# Patient Record
Sex: Female | Born: 1955 | Hispanic: No | Marital: Married
Health system: Southern US, Community
[De-identification: ages and names within clinical notes are randomized; demographics above are authoritative.]

---

## 2000-08-05 ENCOUNTER — Other Ambulatory Visit: Admission: RE | Admit: 2000-08-05 | Discharge: 2000-08-05 | Payer: Self-pay | Admitting: *Deleted

## 2000-09-05 ENCOUNTER — Encounter (INDEPENDENT_AMBULATORY_CARE_PROVIDER_SITE_OTHER): Payer: Self-pay | Admitting: Specialist

## 2000-09-05 ENCOUNTER — Other Ambulatory Visit: Admission: RE | Admit: 2000-09-05 | Discharge: 2000-09-05 | Payer: Self-pay | Admitting: *Deleted

## 2001-08-05 ENCOUNTER — Encounter (INDEPENDENT_AMBULATORY_CARE_PROVIDER_SITE_OTHER): Payer: Self-pay | Admitting: Specialist

## 2001-08-05 ENCOUNTER — Observation Stay (HOSPITAL_COMMUNITY): Admission: RE | Admit: 2001-08-05 | Discharge: 2001-08-06 | Payer: Self-pay | Admitting: *Deleted

## 2004-12-24 ENCOUNTER — Other Ambulatory Visit: Admission: RE | Admit: 2004-12-24 | Discharge: 2004-12-24 | Payer: Self-pay | Admitting: *Deleted

## 2005-01-23 ENCOUNTER — Ambulatory Visit (HOSPITAL_COMMUNITY): Admission: RE | Admit: 2005-01-23 | Discharge: 2005-01-23 | Payer: Self-pay | Admitting: *Deleted

## 2005-02-04 ENCOUNTER — Encounter: Admission: RE | Admit: 2005-02-04 | Discharge: 2005-02-04 | Payer: Self-pay | Admitting: *Deleted

## 2008-01-27 ENCOUNTER — Other Ambulatory Visit: Admission: RE | Admit: 2008-01-27 | Discharge: 2008-01-27 | Payer: Self-pay | Admitting: Family Medicine

## 2008-01-29 ENCOUNTER — Ambulatory Visit (HOSPITAL_COMMUNITY): Admission: RE | Admit: 2008-01-29 | Discharge: 2008-01-29 | Payer: Self-pay | Admitting: Family Medicine

## 2008-02-02 ENCOUNTER — Ambulatory Visit (HOSPITAL_COMMUNITY): Admission: RE | Admit: 2008-02-02 | Discharge: 2008-02-02 | Payer: Self-pay | Admitting: Family Medicine

## 2010-05-25 NOTE — Discharge Summary (Signed)
   NAME:  Sheila Gomez, Sheila Gomez                         ACCOUNT NO.:  [id number]   MEDICAL RECORD NO.:  [id number]                   PATIENT TYPE:  OBV   LOCATION:  0457                                 FACILITY:  [facility]   PHYSICIAN:  Almedia Balls. Fore, M.D.                DATE OF BIRTH:  26-Sep-1955   DATE OF ADMISSION:  08/05/2001  DATE OF DISCHARGE:  08/06/2001                                 DISCHARGE SUMMARY   HISTORY:  The patient is a 55 year old with rapid uterine enlargement,  abnormal uterine bleeding for hysterectomy, possible bilateral salpingo-  oophorectomy.  The remainder of her history and physical are as previously  dictated.   LABORATORY DATA:  Preoperative hemoglobin 12.3 with 3600 white blood cells  and normal differential.   HOSPITAL COURSE:  The patient was taken to the operating room on August 05, 2001 at which time abdominal supracervical hysterectomy, right salpingo-  oophorectomy were performed.  The patient did well postoperatively.  Diet  and ambulation were progressed over the evening of July 30 and early morning  of July 31.  On the morning of July 31 she was afebrile and experiencing no  problems and it was felt that she could be discharged at this time.   FINAL DIAGNOSES:  Abnormal uterine bleeding, rapid uterine enlargement,  pelvic pain.   OPERATION:  Abdominal supracervical hysterectomy, right salpingo-  oophorectomy.  Pathology report unavailable at the time of dictation.   DISPOSITION:  Discharged home to return to the office in two weeks for  follow-up.  She was instructed to gradually progress her activities over  several weeks at home and to limit lifting and driving for two weeks.  She  was fully ambulatory, on a regular diet, and in good condition at the time  of discharge.  She was given a prescription for Dilaudid generic 2 mg 30 to  be taken one or two q.4h. p.r.n. pain and doxycycline 100 mg 12 to be taken  one b.i.d.  She will call for any  problems.                                               Almedia Balls. Randell Patient, M.D.    SRF/MEDQ  D:  08/06/2001  T:  08/12/2001  Job:  [id number]

## 2010-05-25 NOTE — Op Note (Signed)
Sheila Gomez, Sheila Gomez                        ACCOUNT NO.:  [id number]   MEDICAL RECORD NO.:  [id number]                   PATIENT TYPE:  AMB   LOCATION:  DAY                                  FACILITY:  [facility]   PHYSICIAN:  Almedia Balls. Fore, M.D.                DATE OF BIRTH:   DATE OF PROCEDURE:  08/05/2001  DATE OF DISCHARGE:                                 OPERATIVE REPORT   PREOPERATIVE DIAGNOSES:  1. Abnormal uterine bleeding.  2. Uterine enlargement, probable leiomyomata, question of adenomyosis.   POSTOPERATIVE DIAGNOSES:  1. Abnormal uterine bleeding.  2. Uterine enlargement, probable leiomyomata, question of adenomyosis.  3. Pending pathology.   OPERATION/PROCEDURE:  Abdominal supracervical hysterectomy, right salpingo-  oophorectomy.   ANESTHESIA:  General orotracheal.   SURGEON:  Almedia Balls. Randell Patient, M.D.   FIRST ASSISTANT:  Dr. Harl Bowie, M.D.   INDICATIONS:  The patient is a 55 year old with the above noted problems.  She was counseled as to the need for surgery and, in fact, the surgery to be  performed.  She was also counseled as to the risks involved including risks  of anesthesia, injury to bowel, bladder, blood vessels, ureters,  postoperative hemorrhage, infection, recuperation and use of hormone  replacement should her ovaries be removed.  She fully understands all these  considerations and wishes to proceed on 08/05/2001.   OPERATIVE FINDINGS:  On entry into the abdomen, the uterus was enlarged to  approximately [redacted] weeks gestational size.  There was a cyst present on the  right ovary which was hemorrhagic.  There was a smaller hemorrhagic corpus  luteum on the left ovary as well.  Otherwise the pelvic viscera were normal.  The abdominal viscera were normal.  The upper abdominal viscera were normal  to palpation.   DESCRIPTION OF PROCEDURE:  With the patient under general anesthesia, she  was prepped and draped in the usual sterile fashion.  I left  a  Foley  catheter in the bladder.  A abdominal transverse incision was made and  carried into the peritoneal cavity without difficulty.  The uterine fundus  was brought up through the incision, and Kelly clamps were placed across the  uterine-ovarian anastomosis.  tubes and round ligaments bilaterally for  traction and  hemostasis.  Suture ligatures were used to secure the round  ligaments and then transected with opening of the retroperitoneal space and  development of the bladder flap anteriorly.  Because of the length of the  uterine-ovarian anastomoses and tube attachments, it was necessary to remove  these sequentially.  Because of the large hemorrhagic cyst on the right  ovary, this was excised by clamping the infundibulopelvic ligament lateral  to the ovary, cutting it free and doubly ligating the infundibulopelvic  ligament.  On the left, the left tube and ovary were conserved by clamping  the uterine-ovarian anastomosis into which were then cut free of the  uterus  and doubly ligated with #1 chromic catgut.  Bladder flap was developed and  advanced over the lower uterine segment and cervix.  Heaney clamps were then  used after skeletonization of the vessels bilaterally to clamp these vessels  which were then cut and suture ligated with #1 chromic catgut.  Heaney  clamps were used to clamp the cardinal ligaments bilaterally which were also  cut and suture ligated with #1 chromic catgut.  It was then possible to  excise the uterine fundus by using Bovie electrocoagulation to remove the  fundus and core out the cervix.  This was accomplished without difficulty.  Interrupted figure-of-eight sutures of 0 chromic catgut were placed across  the cervix for reapproximation and hemostasis.  The area was lavaged with  copious amounts of lactated Ringer's.  Finally, after noting that hemostasis  was maintained and the sponge and instrument counts were correct, and after  reperitonealizing the  surgical site with interrupted suture of #1 chromic  catgut, the left tube and ovary were suspended from the left round ligament  with an interrupted suture of #1 chromic catgut.  Again, after noting the  hemostasis was maintained and the sponge and instrument counts were correct,  the peritoneum was closed with a continuous suture of 0 Vicryl.  Fascia was  closed with two sutures of 0 Vicryl.  This was brought from the lateral  aspects of the incision and tied separately in the midline.  Subcutaneous  fat was reapproximated with interrupted sutures of 0 Vicryl.  Skin was  closed with a subcuticular suture of 3-0 plain catgut.  Estimated blood loss  400 ml.  The patient was taken to the recovery room in good condition with  clear urine in the Foley catheter tubing.  She will be placed on 23-hour  observation following the surgery.                                                 Almedia Balls. Randell Patient, M.D.    SRF/MEDQ  D:  08/05/2001  T:  08/08/2001  Job:  [id number]

## 2010-05-25 NOTE — H&P (Signed)
NAME:  Sheila Gomez, Sheila Gomez                         ACCOUNT NO.: [id number]   MEDICAL RECORD NO.:  [id number]                   PATIENT TYPE: OBV   LOCATION:  0457                                 FACILITY:  [facility]   PHYSICIAN:  Almedia Balls. Fore, M.D.                DATE OF BIRTH:  Dec 19, 1955   DATE OF ADMISSION:  08/05/2001  DATE OF DISCHARGE:             HISTORY & PHYSICAL   CHIEF COMPLAINT:  Probable fibroids, heavybleeding.   HISTORY:  The patient is a 55 year old gravida 3, para 3, whose last  menstrual period was late June 2003. She has been followed in our office  over the past year with gradually increasing probable fibroids of the  uterus. She underwent a hysteroscopy, D&C in August 2002 with benign  findings. She was also experiencing extremely heavy menses and has had  continuationof this problem. An examination  in early July this year  revealed the uterus to be enlarged even further, and to be approximately  three to four fingerbreadths above the umbilicus at this point.  Because of  the relatively rampant enlargement and continued abnormal bleeding and  abdominal discomfort, she is admitted at this time for abdominal  hysterectomy and possible bilateral salpingo oophorectomy.  This procedure  has been  fully discussed with her to include the procedure itself, the  risks of the  anesthesia, injury to bowel and bladder, blood vessels,  ureters, postoperative hemorrhage, infection, recuperation and possible  hormone replacement should her ovaries be removed. She fully understands all  these considerations and wishes to proceed on August 05, 2001. Her Pap smear  was normal within  the past year.   PAST MEDICAL HISTORY:  She had a tubal ligation in approximately 1990.   ALLERGIES:  Sheis allergic to no medications.   MEDICATIONS:  She has taken only iron andvitamins over the past year for  improvement in her hemoglobin.   FAMILY HISTORY:  Noncontributory.   REVIEW OF SYMPTOMS:   HEENT:  Negative.  CARDIORESPIRATORY:  Negative.  GASTROINTESTINAL:  Negative.  GENITOURINARY:  As in present illness.  NEUROMUSCULAR:  Negative.   PHYSICAL EXAMINATION:  GENERAL:  Height 5 feet, 3-1/2 inches, weight 155  pounds. A well developed black female in no acute  distress.   VITAL SIGNS:  Blood pressure 132-80, pulse 72, respirations 18.   HEENT:  Within normal limits.   NECK:  Supple without masses or adenopathy or bruits.   HEART:  Regular rate and rhythm without murmurs.   LUNGS:  Clear to auscultation and percussion.   BREAST: Examination sitting and lying without mass, axilla negative.   ABDOMEN:  Flat and soft was mass effect to above the umbilicus. The mass is  irregular and slightly tender.   PELVIC:  Examination, external genitalia Bartholin's, urethra and Skene's  glands within normal limits.  Cervix slightly inflamed. Uterus antero mid in  position and enlarged to approximately 22 to [redacted] weeks gestational size, very  irregular. It is  not possible to palpate the  adnexa, althoughan ultrasound  done within  the past year has been  normal.  Anterior and posterior cul-de-  sac examination was confirmatory.   EXTREMITIES:  Within normal limits.   CENTRAL NERVOUS SYSTEM:  Grossly intact.   SKIN:  Without suspicious lesions.   IMPRESSION:  Uterine  enlargement, probable leiomyomata, abnormal bleeding  and pain.   DISPOSITION:  As noted above.                                                   Almedia Balls. Randell Patient, M.D.    SRF/MEDQ  D:  08/03/2001  T:  08/05/2001  Job:  [id number]

## 2010-08-08 ENCOUNTER — Other Ambulatory Visit (HOSPITAL_COMMUNITY): Payer: Self-pay | Admitting: Family Medicine

## 2010-08-08 DIAGNOSIS — Z1231 Encounter for screening mammogram for malignant neoplasm of breast: Secondary | ICD-10-CM

## 2010-08-19 ENCOUNTER — Other Ambulatory Visit (HOSPITAL_COMMUNITY)
Admission: RE | Admit: 2010-08-19 | Discharge: 2010-08-19 | Disposition: A | Discharge status: Home / Self Care | Payer: BC Managed Care – PPO | Source: Ambulatory Visit | Attending: Family Medicine | Admitting: Family Medicine

## 2010-08-19 DIAGNOSIS — Z1159 Encounter for screening for other viral diseases: Secondary | ICD-10-CM | POA: Insufficient documentation

## 2010-08-20 ENCOUNTER — Ambulatory Visit (HOSPITAL_COMMUNITY)
Admission: RE | Admit: 2010-08-20 | Discharge: 2010-08-20 | Disposition: A | Discharge status: Home / Self Care | Payer: BC Managed Care – PPO | Source: Ambulatory Visit | Attending: Family Medicine | Admitting: Family Medicine

## 2010-08-20 DIAGNOSIS — Z1231 Encounter for screening mammogram for malignant neoplasm of breast: Secondary | ICD-10-CM | POA: Insufficient documentation

## 2010-08-31 ENCOUNTER — Other Ambulatory Visit: Payer: Self-pay | Admitting: Family Medicine

## 2010-09-03 ENCOUNTER — Other Ambulatory Visit (HOSPITAL_COMMUNITY)
Admission: RE | Admit: 2010-09-03 | Discharge: 2010-09-03 | Disposition: A | Discharge status: Home / Self Care | Payer: BC Managed Care – PPO | Source: Ambulatory Visit | Attending: Family Medicine | Admitting: Family Medicine

## 2010-09-03 DIAGNOSIS — Z01419 Encounter for gynecological examination (general) (routine) without abnormal findings: Secondary | ICD-10-CM | POA: Insufficient documentation

## 2012-02-18 ENCOUNTER — Other Ambulatory Visit: Payer: Self-pay | Admitting: Family Medicine

## 2012-02-18 ENCOUNTER — Other Ambulatory Visit (HOSPITAL_COMMUNITY)
Admission: RE | Admit: 2012-02-18 | Discharge: 2012-02-18 | Disposition: A | Discharge status: Home / Self Care | Payer: BC Managed Care – PPO | Source: Ambulatory Visit | Attending: Family Medicine | Admitting: Family Medicine

## 2012-02-18 ENCOUNTER — Ambulatory Visit
Admission: RE | Admit: 2012-02-18 | Discharge: 2012-02-18 | Disposition: A | Discharge status: Home / Self Care | Payer: BC Managed Care – PPO | Source: Ambulatory Visit | Attending: Family Medicine | Admitting: Family Medicine

## 2012-02-18 DIAGNOSIS — R0689 Other abnormalities of breathing: Secondary | ICD-10-CM

## 2012-02-18 DIAGNOSIS — Z01419 Encounter for gynecological examination (general) (routine) without abnormal findings: Secondary | ICD-10-CM | POA: Insufficient documentation

## 2012-02-26 ENCOUNTER — Other Ambulatory Visit (HOSPITAL_COMMUNITY): Payer: Self-pay | Admitting: Family Medicine

## 2012-02-26 DIAGNOSIS — Z1231 Encounter for screening mammogram for malignant neoplasm of breast: Secondary | ICD-10-CM

## 2012-03-03 ENCOUNTER — Ambulatory Visit (HOSPITAL_COMMUNITY)
Admission: RE | Admit: 2012-03-03 | Discharge: 2012-03-03 | Disposition: A | Discharge status: Home / Self Care | Payer: BC Managed Care – PPO | Source: Ambulatory Visit | Attending: Family Medicine | Admitting: Family Medicine

## 2012-03-03 DIAGNOSIS — Z1231 Encounter for screening mammogram for malignant neoplasm of breast: Secondary | ICD-10-CM | POA: Insufficient documentation

## 2012-03-05 ENCOUNTER — Other Ambulatory Visit: Payer: Self-pay | Admitting: Family Medicine

## 2012-03-17 ENCOUNTER — Ambulatory Visit
Admission: RE | Admit: 2012-03-17 | Discharge: 2012-03-17 | Disposition: A | Discharge status: Home / Self Care | Payer: BC Managed Care – PPO | Source: Ambulatory Visit | Attending: Family Medicine | Admitting: Family Medicine

## 2012-03-17 DIAGNOSIS — R928 Other abnormal and inconclusive findings on diagnostic imaging of breast: Secondary | ICD-10-CM

## 2013-08-19 ENCOUNTER — Other Ambulatory Visit (HOSPITAL_COMMUNITY): Payer: Self-pay | Admitting: Family Medicine

## 2013-08-19 DIAGNOSIS — Z1231 Encounter for screening mammogram for malignant neoplasm of breast: Secondary | ICD-10-CM

## 2013-08-25 ENCOUNTER — Ambulatory Visit (HOSPITAL_COMMUNITY)
Admission: RE | Admit: 2013-08-25 | Discharge: 2013-08-25 | Disposition: A | Discharge status: Home / Self Care | Payer: BC Managed Care – PPO | Source: Ambulatory Visit | Attending: Family Medicine | Admitting: Family Medicine

## 2013-08-25 DIAGNOSIS — Z1231 Encounter for screening mammogram for malignant neoplasm of breast: Secondary | ICD-10-CM | POA: Diagnosis not present

## 2014-10-17 ENCOUNTER — Other Ambulatory Visit: Payer: Self-pay

## 2014-10-17 DIAGNOSIS — Z1231 Encounter for screening mammogram for malignant neoplasm of breast: Secondary | ICD-10-CM

## 2014-10-24 ENCOUNTER — Ambulatory Visit
Admission: RE | Admit: 2014-10-24 | Discharge: 2014-10-24 | Disposition: A | Discharge status: Home / Self Care | Payer: BLUE CROSS/BLUE SHIELD | Source: Ambulatory Visit

## 2014-10-24 DIAGNOSIS — Z1231 Encounter for screening mammogram for malignant neoplasm of breast: Secondary | ICD-10-CM

## 2015-10-19 ENCOUNTER — Other Ambulatory Visit: Payer: Self-pay | Admitting: Family Medicine

## 2015-10-19 ENCOUNTER — Other Ambulatory Visit (HOSPITAL_COMMUNITY)
Admission: RE | Admit: 2015-10-19 | Discharge: 2015-10-19 | Disposition: A | Discharge status: Home / Self Care | Payer: BLUE CROSS/BLUE SHIELD | Source: Ambulatory Visit | Attending: Family Medicine | Admitting: Family Medicine

## 2015-10-19 DIAGNOSIS — Z1151 Encounter for screening for human papillomavirus (HPV): Secondary | ICD-10-CM | POA: Diagnosis present

## 2015-10-19 DIAGNOSIS — Z01419 Encounter for gynecological examination (general) (routine) without abnormal findings: Secondary | ICD-10-CM | POA: Insufficient documentation

## 2015-10-20 ENCOUNTER — Other Ambulatory Visit: Payer: Self-pay | Admitting: Family Medicine

## 2015-10-20 DIAGNOSIS — Z1231 Encounter for screening mammogram for malignant neoplasm of breast: Secondary | ICD-10-CM

## 2015-10-24 LAB — CYTOLOGY - PAP
Diagnosis: NEGATIVE
HPV (WINDOPATH): NOT DETECTED

## 2015-11-16 ENCOUNTER — Ambulatory Visit
Admission: RE | Admit: 2015-11-16 | Discharge: 2015-11-16 | Disposition: A | Discharge status: Home / Self Care | Payer: BLUE CROSS/BLUE SHIELD | Source: Ambulatory Visit | Attending: Family Medicine | Admitting: Family Medicine

## 2015-11-16 DIAGNOSIS — Z1231 Encounter for screening mammogram for malignant neoplasm of breast: Secondary | ICD-10-CM

## 2016-10-31 ENCOUNTER — Other Ambulatory Visit: Payer: Self-pay | Admitting: Family Medicine

## 2016-10-31 DIAGNOSIS — Z1231 Encounter for screening mammogram for malignant neoplasm of breast: Secondary | ICD-10-CM

## 2016-11-22 ENCOUNTER — Other Ambulatory Visit: Payer: Self-pay | Admitting: Family Medicine

## 2016-11-22 DIAGNOSIS — E2839 Other primary ovarian failure: Secondary | ICD-10-CM

## 2016-12-05 ENCOUNTER — Ambulatory Visit: Payer: BLUE CROSS/BLUE SHIELD

## 2017-01-01 ENCOUNTER — Ambulatory Visit
Admission: RE | Admit: 2017-01-01 | Discharge: 2017-01-01 | Disposition: A | Discharge status: Home / Self Care | Payer: BLUE CROSS/BLUE SHIELD | Source: Ambulatory Visit | Attending: Family Medicine | Admitting: Family Medicine

## 2017-01-01 DIAGNOSIS — E2839 Other primary ovarian failure: Secondary | ICD-10-CM

## 2017-01-01 DIAGNOSIS — Z1231 Encounter for screening mammogram for malignant neoplasm of breast: Secondary | ICD-10-CM

## 2017-12-12 ENCOUNTER — Other Ambulatory Visit: Payer: Self-pay | Admitting: Family Medicine

## 2017-12-12 DIAGNOSIS — Z1231 Encounter for screening mammogram for malignant neoplasm of breast: Secondary | ICD-10-CM

## 2018-01-21 ENCOUNTER — Ambulatory Visit
Admission: RE | Admit: 2018-01-21 | Discharge: 2018-01-21 | Disposition: A | Discharge status: Home / Self Care | Payer: Managed Care, Other (non HMO) | Source: Ambulatory Visit | Attending: Family Medicine | Admitting: Family Medicine

## 2018-01-21 DIAGNOSIS — Z1231 Encounter for screening mammogram for malignant neoplasm of breast: Secondary | ICD-10-CM

## 2018-08-05 ENCOUNTER — Other Ambulatory Visit: Payer: Self-pay | Admitting: Family Medicine

## 2018-08-05 DIAGNOSIS — Z20822 Contact with and (suspected) exposure to covid-19: Secondary | ICD-10-CM

## 2018-08-07 LAB — NOVEL CORONAVIRUS, NAA: SARS-CoV-2, NAA: NOT DETECTED

## 2018-11-16 ENCOUNTER — Other Ambulatory Visit: Payer: Self-pay

## 2018-11-16 DIAGNOSIS — Z20822 Contact with and (suspected) exposure to covid-19: Secondary | ICD-10-CM

## 2018-11-17 LAB — NOVEL CORONAVIRUS, NAA: SARS-CoV-2, NAA: NOT DETECTED

## 2018-12-28 ENCOUNTER — Ambulatory Visit: Payer: Managed Care, Other (non HMO) | Attending: Internal Medicine

## 2018-12-28 ENCOUNTER — Other Ambulatory Visit: Payer: Self-pay | Admitting: Family Medicine

## 2018-12-28 DIAGNOSIS — Z1231 Encounter for screening mammogram for malignant neoplasm of breast: Secondary | ICD-10-CM

## 2018-12-28 DIAGNOSIS — Z20822 Contact with and (suspected) exposure to covid-19: Secondary | ICD-10-CM

## 2018-12-29 LAB — NOVEL CORONAVIRUS, NAA: SARS-CoV-2, NAA: NOT DETECTED

## 2019-02-08 ENCOUNTER — Ambulatory Visit: Payer: Managed Care, Other (non HMO) | Attending: Internal Medicine

## 2019-02-08 DIAGNOSIS — Z20822 Contact with and (suspected) exposure to covid-19: Secondary | ICD-10-CM

## 2019-02-09 LAB — NOVEL CORONAVIRUS, NAA: SARS-CoV-2, NAA: NOT DETECTED

## 2019-02-10 ENCOUNTER — Other Ambulatory Visit: Payer: Self-pay

## 2019-02-10 ENCOUNTER — Ambulatory Visit
Admission: RE | Admit: 2019-02-10 | Discharge: 2019-02-10 | Disposition: A | Discharge status: Home / Self Care | Payer: Managed Care, Other (non HMO) | Source: Ambulatory Visit | Attending: Family Medicine | Admitting: Family Medicine

## 2019-02-10 DIAGNOSIS — Z1231 Encounter for screening mammogram for malignant neoplasm of breast: Secondary | ICD-10-CM

## 2020-01-14 ENCOUNTER — Other Ambulatory Visit: Payer: Self-pay | Admitting: Family Medicine

## 2020-01-14 DIAGNOSIS — Z1231 Encounter for screening mammogram for malignant neoplasm of breast: Secondary | ICD-10-CM

## 2020-02-23 ENCOUNTER — Ambulatory Visit: Payer: Managed Care, Other (non HMO)

## 2020-04-12 ENCOUNTER — Inpatient Hospital Stay: Admission: RE | Admit: 2020-04-12 | Payer: Managed Care, Other (non HMO) | Source: Ambulatory Visit

## 2020-05-04 ENCOUNTER — Other Ambulatory Visit: Payer: Self-pay

## 2020-05-04 ENCOUNTER — Ambulatory Visit
Admission: RE | Admit: 2020-05-04 | Discharge: 2020-05-04 | Disposition: A | Discharge status: Home / Self Care | Payer: Managed Care, Other (non HMO) | Source: Ambulatory Visit | Attending: Family Medicine | Admitting: Family Medicine

## 2020-05-04 DIAGNOSIS — Z1231 Encounter for screening mammogram for malignant neoplasm of breast: Secondary | ICD-10-CM

## 2021-05-16 ENCOUNTER — Other Ambulatory Visit: Payer: Self-pay | Admitting: Family Medicine

## 2021-05-16 DIAGNOSIS — Z1231 Encounter for screening mammogram for malignant neoplasm of breast: Secondary | ICD-10-CM

## 2021-05-21 ENCOUNTER — Ambulatory Visit
Admission: RE | Admit: 2021-05-21 | Discharge: 2021-05-21 | Disposition: A | Discharge status: Home / Self Care | Payer: Managed Care, Other (non HMO) | Source: Ambulatory Visit | Attending: Family Medicine | Admitting: Family Medicine

## 2021-05-21 DIAGNOSIS — Z1231 Encounter for screening mammogram for malignant neoplasm of breast: Secondary | ICD-10-CM

## 2022-04-09 ENCOUNTER — Other Ambulatory Visit: Payer: Self-pay | Admitting: Family Medicine

## 2022-04-09 DIAGNOSIS — Z1231 Encounter for screening mammogram for malignant neoplasm of breast: Secondary | ICD-10-CM

## 2022-06-24 ENCOUNTER — Ambulatory Visit
Admission: RE | Admit: 2022-06-24 | Discharge: 2022-06-24 | Disposition: A | Discharge status: Home / Self Care | Payer: Managed Care, Other (non HMO) | Source: Ambulatory Visit | Attending: Family Medicine | Admitting: Family Medicine

## 2022-06-24 DIAGNOSIS — Z1231 Encounter for screening mammogram for malignant neoplasm of breast: Secondary | ICD-10-CM

## 2022-12-16 IMAGING — MG MM DIGITAL SCREENING BILAT W/ TOMO AND CAD
8 series · 8 of 24 positions shown · non-contrast
Comparison: Previous exam(s).

CLINICAL DATA: Screening.

EXAM:
DIGITAL SCREENING BILATERAL MAMMOGRAM WITH TOMOSYNTHESIS AND CAD
TECHNIQUE: Bilateral screening digital craniocaudal and mediolateral oblique
mammograms were obtained. Bilateral screening digital breast
tomosynthesis was performed. The images were evaluated with
computer-aided detection.

[L CC synth-2D]
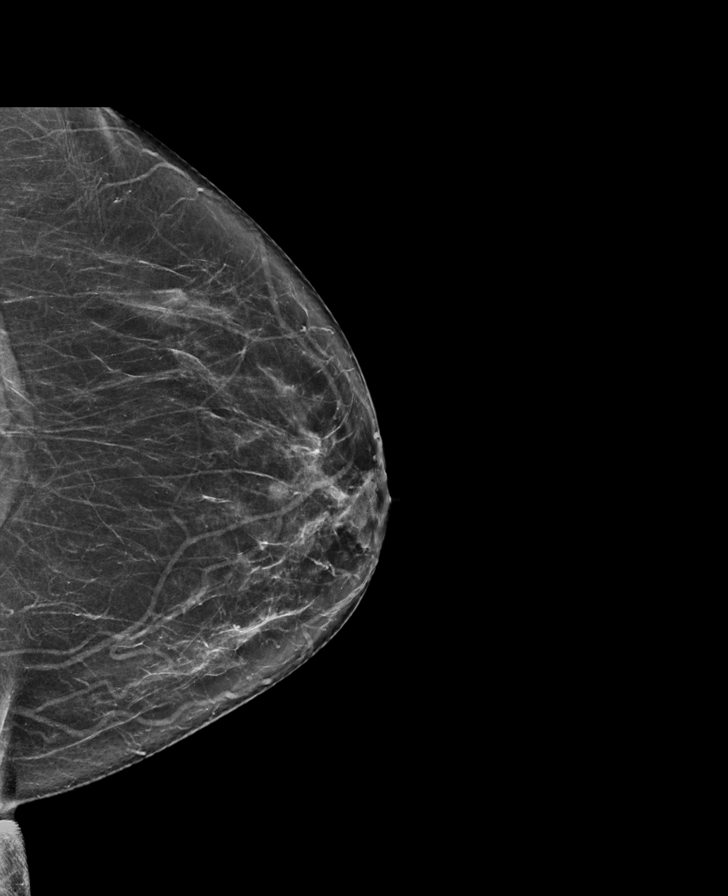

[R MLO synth-2D]
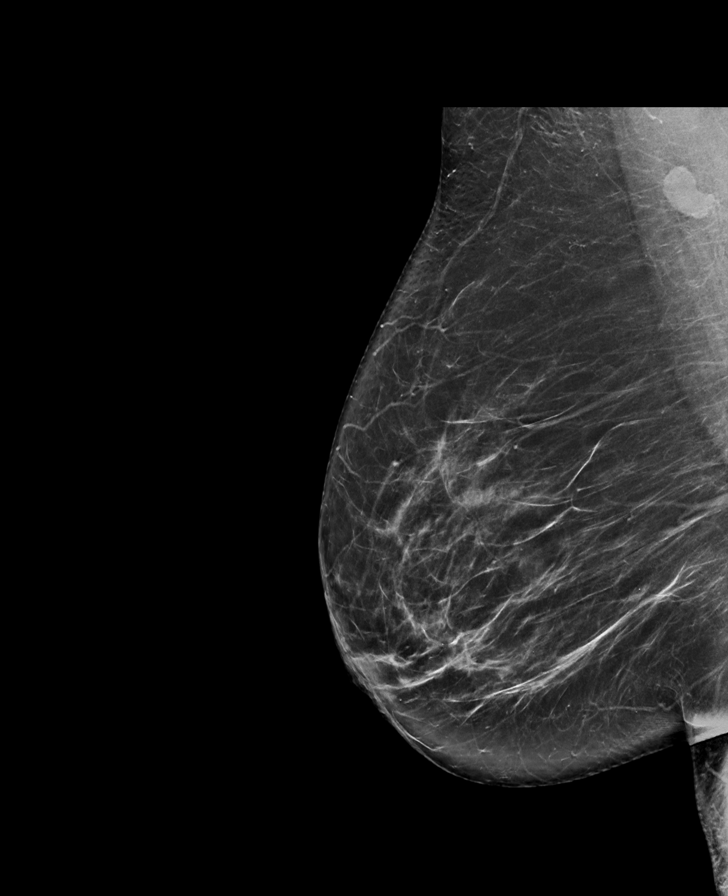

[R CC synth-2D]
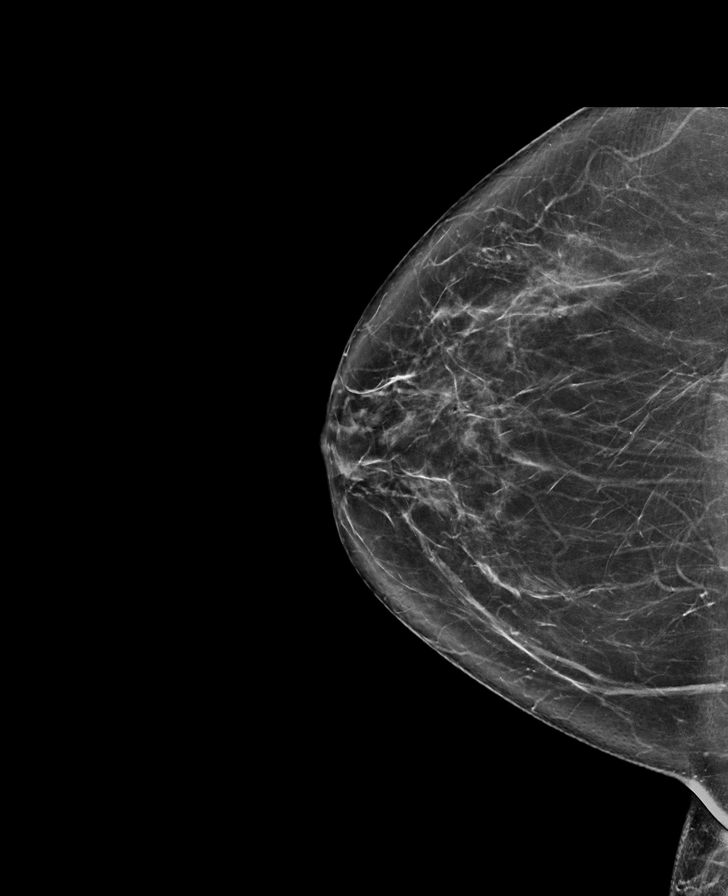

[L MLO synth-2D]
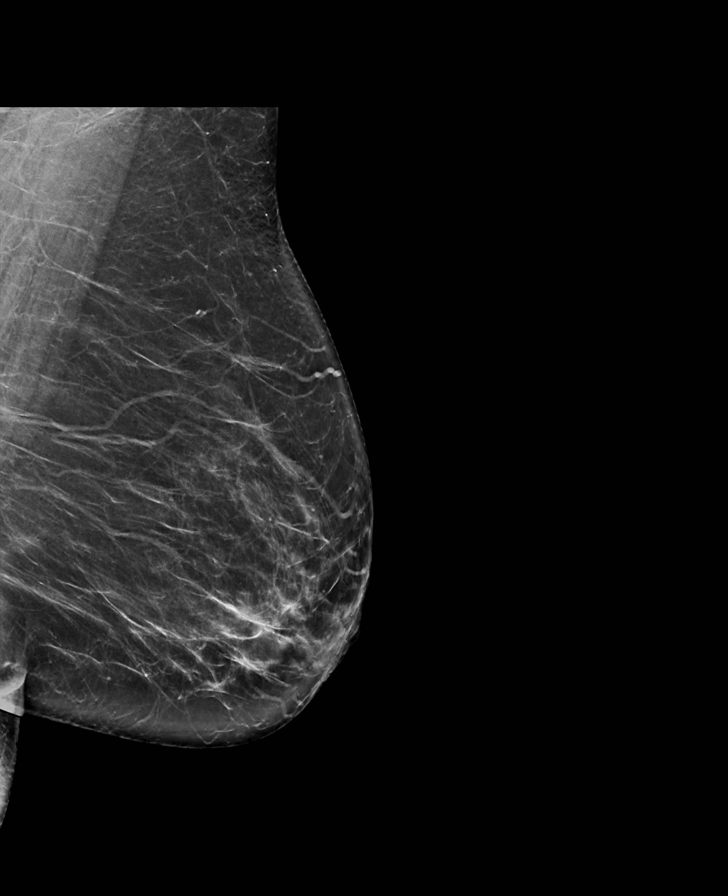

[L MLO tomo · tomo slice 40/79.0]
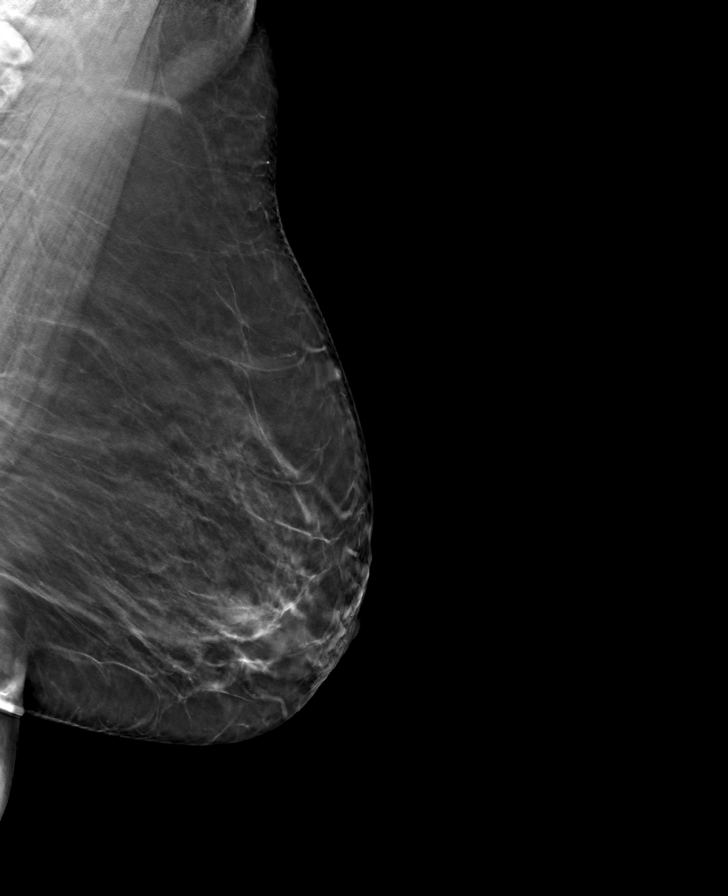

[R CC tomo · tomo slice 39/77.0]
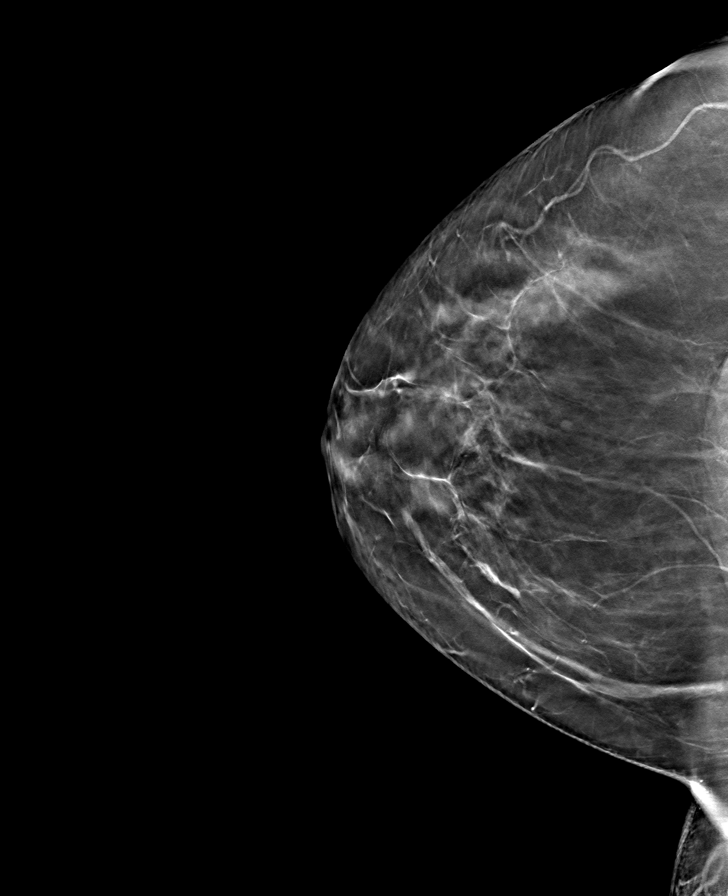

[R MLO tomo · tomo slice 42/83.0]
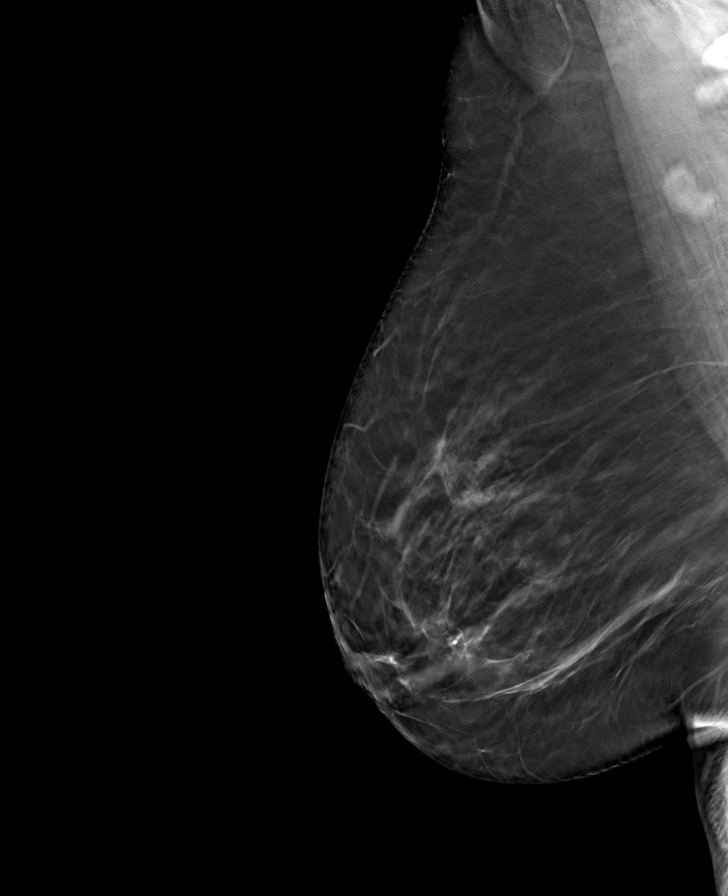

[L CC tomo · tomo slice 37/72.0]
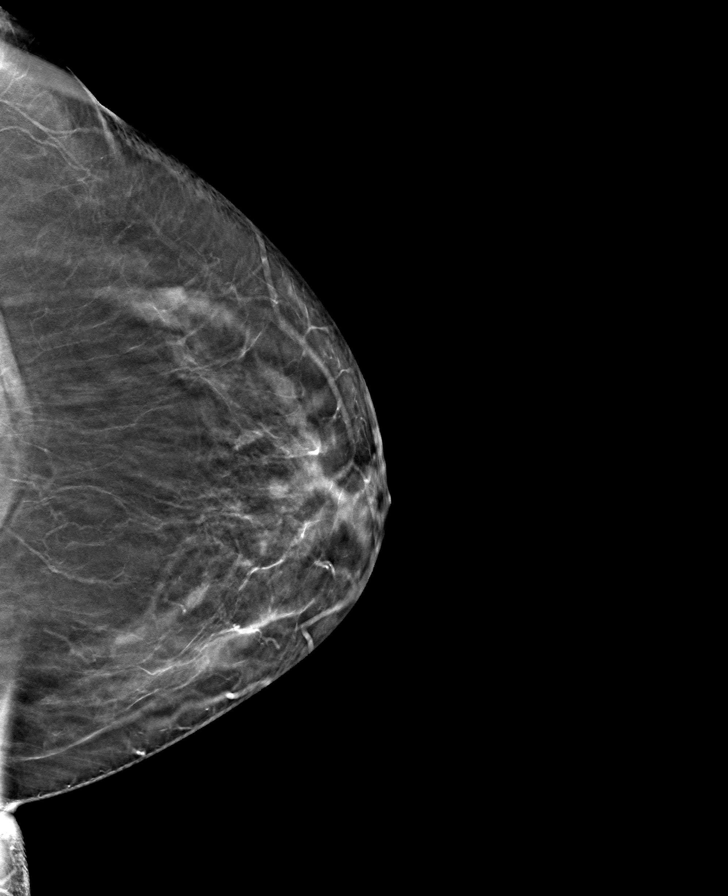

[8 of 24 positions shown; findings below may reference images not displayed]

ACR Breast Density Category b: There are scattered areas of
fibroglandular density.
FINDINGS: There are no findings suspicious for malignancy.
IMPRESSION: No mammographic evidence of malignancy. A result letter of this
screening mammogram will be mailed directly to the patient.

RECOMMENDATION:
Screening mammogram in one year. (Code:51-O-LD2)

BI-RADS CATEGORY  1: Negative.

## 2023-05-13 ENCOUNTER — Other Ambulatory Visit: Payer: Self-pay | Admitting: Family Medicine

## 2023-05-13 DIAGNOSIS — Z1231 Encounter for screening mammogram for malignant neoplasm of breast: Secondary | ICD-10-CM

## 2023-06-26 ENCOUNTER — Ambulatory Visit
Admission: RE | Admit: 2023-06-26 | Discharge: 2023-06-26 | Disposition: A | Discharge status: Home / Self Care | Source: Ambulatory Visit | Attending: Family Medicine | Admitting: Family Medicine

## 2023-06-26 DIAGNOSIS — Z1231 Encounter for screening mammogram for malignant neoplasm of breast: Secondary | ICD-10-CM
